# Patient Record
Sex: Female | Born: 1985 | Hispanic: Yes | Marital: Married | Smoking: Current every day smoker
Health system: Southern US, Community
[De-identification: ages and names within clinical notes are randomized; demographics above are authoritative.]

## PROBLEM LIST (undated history)

## (undated) HISTORY — PX: NO PAST SURGERIES: SHX2092

---

## 2020-05-12 ENCOUNTER — Other Ambulatory Visit: Payer: Self-pay

## 2020-05-12 ENCOUNTER — Emergency Department
Admission: EM | Admit: 2020-05-12 | Discharge: 2020-05-12 | Disposition: A | Discharge status: Home / Self Care | Payer: BC Managed Care – PPO | Attending: Emergency Medicine | Admitting: Emergency Medicine

## 2020-05-12 ENCOUNTER — Ambulatory Visit
Admission: EM | Admit: 2020-05-12 | Discharge: 2020-05-12 | Disposition: A | Discharge status: Other Acute Inpatient Hospital | Payer: BC Managed Care – PPO

## 2020-05-12 ENCOUNTER — Emergency Department: Payer: BC Managed Care – PPO

## 2020-05-12 DIAGNOSIS — R197 Diarrhea, unspecified: Secondary | ICD-10-CM | POA: Diagnosis not present

## 2020-05-12 DIAGNOSIS — K2900 Acute gastritis without bleeding: Secondary | ICD-10-CM | POA: Insufficient documentation

## 2020-05-12 DIAGNOSIS — F17210 Nicotine dependence, cigarettes, uncomplicated: Secondary | ICD-10-CM | POA: Insufficient documentation

## 2020-05-12 DIAGNOSIS — R109 Unspecified abdominal pain: Secondary | ICD-10-CM | POA: Diagnosis present

## 2020-05-12 DIAGNOSIS — R112 Nausea with vomiting, unspecified: Secondary | ICD-10-CM | POA: Insufficient documentation

## 2020-05-12 DIAGNOSIS — R1013 Epigastric pain: Secondary | ICD-10-CM | POA: Diagnosis not present

## 2020-05-12 LAB — URINALYSIS, COMPLETE (UACMP) WITH MICROSCOPIC
Bacteria, UA: NONE SEEN
Bilirubin Urine: NEGATIVE
Glucose, UA: NEGATIVE mg/dL
Hgb urine dipstick: NEGATIVE
Ketones, ur: 80 mg/dL — AB
Leukocytes,Ua: NEGATIVE
Nitrite: NEGATIVE
Protein, ur: 100 mg/dL — AB
Specific Gravity, Urine: 1.033 — ABNORMAL HIGH (ref 1.005–1.030)
pH: 6 (ref 5.0–8.0)

## 2020-05-12 LAB — CBC
HCT: 44.3 % (ref 36.0–46.0)
Hemoglobin: 14.7 g/dL (ref 12.0–15.0)
MCH: 31.1 pg (ref 26.0–34.0)
MCHC: 33.2 g/dL (ref 30.0–36.0)
MCV: 93.7 fL (ref 80.0–100.0)
Platelets: 297 10*3/uL (ref 150–400)
RBC: 4.73 MIL/uL (ref 3.87–5.11)
RDW: 13.3 % (ref 11.5–15.5)
WBC: 8.6 10*3/uL (ref 4.0–10.5)
nRBC: 0 % (ref 0.0–0.2)

## 2020-05-12 LAB — COMPREHENSIVE METABOLIC PANEL
ALT: 14 U/L (ref 0–44)
AST: 19 U/L (ref 15–41)
Albumin: 4.7 g/dL (ref 3.5–5.0)
Alkaline Phosphatase: 75 U/L (ref 38–126)
Anion gap: 10 (ref 5–15)
BUN: 10 mg/dL (ref 6–20)
CO2: 25 mmol/L (ref 22–32)
Calcium: 9.2 mg/dL (ref 8.9–10.3)
Chloride: 103 mmol/L (ref 98–111)
Creatinine, Ser: 0.88 mg/dL (ref 0.44–1.00)
GFR calc Af Amer: >60 mL/min (ref >60)
GFR calc non Af Amer: >60 mL/min (ref >60)
Glucose, Bld: 114 mg/dL — ABNORMAL HIGH (ref 70–99)
Potassium: 3.7 mmol/L (ref 3.5–5.1)
Sodium: 138 mmol/L (ref 135–145)
Total Bilirubin: 0.9 mg/dL (ref 0.3–1.2)
Total Protein: 7.9 g/dL (ref 6.5–8.1)

## 2020-05-12 LAB — POCT PREGNANCY, URINE: Preg Test, Ur: NEGATIVE

## 2020-05-12 LAB — LIPASE, BLOOD: Lipase: 29 U/L (ref 11–51)

## 2020-05-12 MED ORDER — IOHEXOL 300 MG/ML  SOLN
100.0000 mL | Freq: Once | INTRAMUSCULAR | Status: AC | PRN
  Administered 2020-05-12: 100 mL via INTRAVENOUS
  Filled 2020-05-12: qty 100

## 2020-05-12 MED ORDER — ONDANSETRON HCL 4 MG/2ML IJ SOLN: 4.0000 mg | Freq: Once | INTRAMUSCULAR | Status: DC

## 2020-05-12 MED ORDER — SODIUM CHLORIDE 0.9% FLUSH
3.0000 mL | Freq: Once | INTRAVENOUS | Status: AC
  Administered 2020-05-12: 3 mL via INTRAVENOUS

## 2020-05-12 MED ORDER — OMEPRAZOLE 20 MG PO CPDR: 20.0000 mg | DELAYED_RELEASE_CAPSULE | Freq: Every day | ORAL | 0 refills | Status: AC

## 2020-05-12 MED ORDER — SODIUM CHLORIDE 0.9 % IV BOLUS
1000.0000 mL | Freq: Once | INTRAVENOUS | Status: AC
  Administered 2020-05-12: 1000 mL via INTRAVENOUS

## 2020-05-12 MED ORDER — PROMETHAZINE HCL 12.5 MG PO TABS
12.5000 mg | ORAL_TABLET | Freq: Four times a day (QID) | ORAL | 0 refills | Status: AC | PRN
Start: 2020-05-12 — End: ?

## 2020-05-12 MED ORDER — ONDANSETRON 4 MG PO TBDP
4.0000 mg | ORAL_TABLET | Freq: Once | ORAL | Status: AC
  Administered 2020-05-12: 4 mg via ORAL
  Filled 2020-05-12: qty 1

## 2020-05-12 MED ORDER — PROMETHAZINE HCL 25 MG/ML IJ SOLN
12.5000 mg | Freq: Once | INTRAMUSCULAR | Status: AC
  Administered 2020-05-12: 12.5 mg via INTRAVENOUS
  Filled 2020-05-12: qty 1

## 2020-05-12 NOTE — ED Notes (Signed)
Patient is being discharged from the Urgent Care and sent to the Emergency Department via POV . Per Wendee Beavers, NP  patient is in need of higher level of care due to Severe Abdominal Pain. Patient is aware and verbalizes understanding of plan of care.  Vitals:   05/12/20 1535  BP: 136/80  Pulse: 82  Resp: 16  Temp: 98.5 F (36.9 C)  SpO2: 99%

## 2020-05-12 NOTE — ED Provider Notes (Signed)
Roderic Palau    CSN: [id number] Arrival date & time: 05/12/20  1533      History   Chief Complaint Chief Complaint  Patient presents with  . Abdominal Pain    HPI Ommie Degeorge is a 34 y.o. female.   Patient presents with abdominal pain since 05/09/2020.  She states the pain is getting worse, 9/10, sharp and throbbing in the epigastric region.  She also reports nausea, vomiting, and diarrhea.  1 episode of emesis today; no diarrhea since yesterday.  She states her symptoms started after drinking a homemade detox juice (pineapple, celery, and other fruits); last had yesterday; none today.  Today she has been unable to eat any food but has had some water to drink.  She denies fever or chills.  The history is provided by the patient.    History reviewed. No pertinent past medical history.  There are no problems to display for this patient.   Past Surgical History:  Procedure Laterality Date  . NO PAST SURGERIES      OB History   No obstetric history on file.      Home Medications    Prior to Admission medications   Not on File    Family History Family History  Problem Relation Age of Onset  . Diabetes Father     Social History Social History   Tobacco Use  . Smoking status: Current Every Day Smoker    Packs/day: 0.50    Types: Cigarettes  . Smokeless tobacco: Never Used  Vaping Use  . Vaping Use: Never used  Substance Use Topics  . Alcohol use: Not Currently  . Drug use: Not Currently     Allergies   Patient has no known allergies.   Review of Systems Review of Systems  Constitutional: Negative for chills and fever.  HENT: Negative for ear pain and sore throat.   Eyes: Negative for pain and visual disturbance.  Respiratory: Negative for cough and shortness of breath.   Cardiovascular: Negative for chest pain and palpitations.  Gastrointestinal: Positive for abdominal pain, diarrhea, nausea and vomiting.  Genitourinary: Negative for  dysuria and hematuria.  Musculoskeletal: Negative for arthralgias and back pain.  Skin: Negative for color change and rash.  Neurological: Negative for seizures and syncope.  All other systems reviewed and are negative.    Physical Exam Triage Vital Signs ED Triage Vitals  Enc Vitals Group     BP 05/12/20 1535 136/80     Pulse Rate 05/12/20 1535 82     Resp 05/12/20 1535 16     Temp 05/12/20 1535 98.5 F (36.9 C)     Temp Source 05/12/20 1535 Oral     SpO2 05/12/20 1535 99 %     Weight 05/12/20 1535 190 lb (86.2 kg)     Height 05/12/20 1535 5\' 6"  (1.676 m)     Head Circumference --      Peak Flow --      Pain Score 05/12/20 1534 9     Pain Loc --      Pain Edu? --      Excl. in [place]? --    No data found.  Updated Vital Signs BP 136/80 (BP Location: Left Arm)   Pulse 82   Temp 98.5 F (36.9 C) (Oral)   Resp 16   Ht 5\' 6"  (1.676 m)   Wt 190 lb (86.2 kg)   LMP 05/05/2020   SpO2 99%   BMI 30.67 kg/m  Visual Acuity Right Eye Distance:   Left Eye Distance:   Bilateral Distance:    Right Eye Near:   Left Eye Near:    Bilateral Near:     Physical Exam Vitals and nursing note reviewed.  Constitutional:      General: She is in acute distress.     Appearance: She is well-developed.     Comments: Patient grimacing and bent over holding abdomen.   HENT:     Head: Normocephalic and atraumatic.     Mouth/Throat:     Mouth: Mucous membranes are moist.  Eyes:     Conjunctiva/sclera: Conjunctivae normal.  Cardiovascular:     Rate and Rhythm: Normal rate and regular rhythm.     Heart sounds: No murmur heard.   Pulmonary:     Effort: Pulmonary effort is normal. No respiratory distress.     Breath sounds: Normal breath sounds.  Abdominal:     General: Bowel sounds are normal.     Palpations: Abdomen is soft.     Tenderness: There is abdominal tenderness in the epigastric area. There is no right CVA tenderness, left CVA tenderness, guarding or rebound.    Musculoskeletal:     Cervical back: Neck supple.  Skin:    General: Skin is warm and dry.     Findings: No rash.  Neurological:     General: No focal deficit present.     Mental Status: She is alert and oriented to person, place, and time.  Psychiatric:        Mood and Affect: Mood normal.        Behavior: Behavior normal.      UC Treatments / Results  Labs (all labs ordered are listed, but only abnormal results are displayed) Labs Reviewed - No data to display  EKG   Radiology No results found.  Procedures Procedures (including critical care time)  Medications Ordered in UC Medications - No data to display  Initial Impression / Assessment and Plan / UC Course  I have reviewed the triage vital signs and the nursing notes.  Pertinent labs & imaging results that were available during my care of the patient were reviewed by me and considered in my medical decision making (see chart for details).   Abdominal pain; Nausea, vomiting, and diarrhea.  Sending patient to ED for evaluation.  Patient declines EMS; states she is stable to drive herself.     Final Clinical Impressions(s) / UC Diagnoses   Final diagnoses:  Abdominal pain, epigastric  Nausea, vomiting, and diarrhea     Discharge Instructions     Go to the Emergency Department for evaluation of your acute abdominal pain.     ED Prescriptions    None     PDMP not reviewed this encounter.   Mickie Bail, NP 05/12/20 1559

## 2020-05-12 NOTE — ED Notes (Addendum)
Pt states was told she would be given pain & nausea meds right before d/c. Notified provider Triplett of pt's statement.

## 2020-05-12 NOTE — Discharge Instructions (Addendum)
Go to the Emergency Department for evaluation of your acute abdominal pain.

## 2020-05-12 NOTE — ED Triage Notes (Signed)
Patient states that on Monday she started drinking a Detox juice and has been having abdominal pain and diarrhea since.

## 2020-05-12 NOTE — ED Notes (Signed)
Pt walked out to car and returned stating she does want to be seen. Pt placed back in line.

## 2020-05-12 NOTE — ED Notes (Signed)
Pt ambulatory to bathroom after reporting nausea. Pt found vomiting in bathroom. Pt then able to ambulate back without distress.

## 2020-05-12 NOTE — ED Triage Notes (Signed)
Pt here with abd that started Monday and has gotten worse.

## 2020-05-16 NOTE — ED Provider Notes (Signed)
[facility] Emergency Department Provider Note ____________________________________________   First MD Initiated Contact with Patient 05/12/20 2046     (approximate)  I have reviewed the triage vital signs and the nursing notes.   HISTORY  Chief Complaint Abdominal Pain  HPI Jade Carter is a 34 y.o. female presenting to the emergency department for treatment and evaluation of abdominal pain.  Pain started 4 days ago and has progressively worsened.  Food seems to make the pain worse.  No alleviating measures attempted prior to arrival.  No similar symptoms in the past.       History reviewed. No pertinent past medical history.  There are no problems to display for this patient.   Past Surgical History:  Procedure Laterality Date  . NO PAST SURGERIES      Prior to Admission medications   Medication Sig Start Date End Date Taking? Authorizing Provider  omeprazole (PRILOSEC) 20 MG capsule Take 1 capsule (20 mg total) by mouth daily. 05/12/20   Herman Fiero, Kasandra Knudsen, FNP  promethazine (PHENERGAN) 12.5 MG tablet Take 1 tablet (12.5 mg total) by mouth every 6 (six) hours as needed for nausea or vomiting. 05/12/20   Kem Boroughs B, FNP    Allergies Patient has no known allergies.  Family History  Problem Relation Age of Onset  . Diabetes Father     Social History Social History   Tobacco Use  . Smoking status: Current Every Day Smoker    Packs/day: 0.50    Types: Cigarettes  . Smokeless tobacco: Never Used  Vaping Use  . Vaping Use: Never used  Substance Use Topics  . Alcohol use: Not Currently  . Drug use: Not Currently    Review of Systems  Constitutional: No fever/chills Eyes: No visual changes. ENT: No sore throat. Cardiovascular: Denies chest pain. Respiratory: Denies shortness of breath. Gastrointestinal: Positive abdominal pain.  Positive for nausea, no vomiting.  No diarrhea.  No constipation. Genitourinary: Negative for  dysuria. Musculoskeletal: Negative for back pain. Skin: Negative for rash. Neurological: Negative for headaches, focal weakness or numbness. ____________________________________________   PHYSICAL EXAM:  VITAL SIGNS: ED Triage Vitals  Enc Vitals Group     BP 05/12/20 1614 (!) 130/92     Pulse Rate 05/12/20 1614 94     Resp 05/12/20 1614 17     Temp 05/12/20 1614 98.8 F (37.1 C)     Temp Source 05/12/20 1614 Oral     SpO2 05/12/20 1614 100 %     Weight 05/12/20 1615 184 lb (83.5 kg)     Height 05/12/20 1615 5\' 6"  (1.676 m)     Head Circumference --      Peak Flow --      Pain Score 05/12/20 1625 8     Pain Loc --      Pain Edu? --      Excl. in [place]? --     Constitutional: Alert and oriented. Well appearing and in no acute distress. Eyes: Conjunctivae are normal. PERRL. EOMI. Head: Atraumatic. Nose: No congestion/rhinnorhea. Mouth/Throat: Mucous membranes are moist.  Oropharynx non-erythematous. Neck: No stridor.   Hematological/Lymphatic/Immunilogical: No cervical lymphadenopathy. Cardiovascular: Normal rate, regular rhythm. Grossly normal heart sounds.  Good peripheral circulation. Respiratory: Normal respiratory effort.  No retractions. Lungs CTAB. Gastrointestinal: Soft and nontender. No distention. No abdominal bruits. No CVA tenderness. Genitourinary:  Musculoskeletal: No lower extremity tenderness nor edema.  No joint effusions. Neurologic:  Normal speech and language. No gross focal neurologic deficits are  appreciated. No gait instability. Skin:  Skin is warm, dry and intact. No rash noted. Psychiatric: Mood and affect are normal. Speech and behavior are normal.  ____________________________________________   LABS (all labs ordered are listed, but only abnormal results are displayed)  Labs Reviewed  COMPREHENSIVE METABOLIC PANEL - Abnormal; Notable for the following components:      Result Value   Glucose, Bld 114 (*)    All other components within normal  limits  URINALYSIS, COMPLETE (UACMP) WITH MICROSCOPIC - Abnormal; Notable for the following components:   Color, Urine AMBER (*)    APPearance CLOUDY (*)    Specific Gravity, Urine 1.033 (*)    Ketones, ur 80 (*)    Protein, ur 100 (*)    All other components within normal limits  LIPASE, BLOOD  CBC  POC URINE PREG, ED  POCT PREGNANCY, URINE   ____________________________________________  EKG  Not indicated ____________________________________________  RADIOLOGY  ED MD interpretation:    CT of the abdomen and pelvis with contrast shows gastritis/peptic ulcer disease.  I, Kem Boroughs, personally viewed and evaluated these images (plain radiographs) as part of my medical decision making, as well as reviewing the written report by the radiologist.  Official radiology report(s): No results found.  ____________________________________________   PROCEDURES  Procedure(s) performed (including Critical Care):  Procedures  ____________________________________________   INITIAL IMPRESSION / ASSESSMENT AND PLAN    34 year old female presenting to the emergency department for treatment and evaluation of 4 days of abdominal pain and nausea.  See HPI for further details.  Plan will be to review labs that were drawn under protocol while awaiting ER room assignment.  Also will order CT of the abdomen and pelvis.  Patient is aware and agrees with plan.  DIFFERENTIAL DIAGNOSIS  Diverticulitis, diverticulosis, appendicitis, acute cholecystitis, gastritis  ED COURSE  CT shows gastritis/peptic ulcer disease.  She will be given prescriptions for promethazine and omeprazole.  She will also be given follow-up information for GI.  She is to schedule follow-up appointment with her primary care provider as well.  She is to return to the emergency department for symptoms of change or worsen if she is unable to schedule an appointment. ____________________________________________   FINAL  CLINICAL IMPRESSION(S) / ED DIAGNOSES  Final diagnoses:  Acute gastritis without hemorrhage, unspecified gastritis type     ED Discharge Orders         Ordered    promethazine (PHENERGAN) 12.5 MG tablet  Every 6 hours PRN     Discontinue  Reprint     05/12/20 2200    omeprazole (PRILOSEC) 20 MG capsule  Daily     Discontinue  Reprint     05/12/20 2200           Kylia Grajales was evaluated in Emergency Department on 05/16/2020 for the symptoms described in the history of present illness. She was evaluated in the context of the global COVID-19 pandemic, which necessitated consideration that the patient might be at risk for infection with the SARS-CoV-2 virus that causes COVID-19. Institutional protocols and algorithms that pertain to the evaluation of patients at risk for COVID-19 are in a state of rapid change based on information released by regulatory bodies including the CDC and federal and state organizations. These policies and algorithms were followed during the patient's care in the ED.   Note:  This document was prepared using Dragon voice recognition software and may include unintentional dictation errors.   Chinita Pester, FNP 05/16/20 1933  Carrie Mew, MD 05/23/20 2017

## 2022-02-02 IMAGING — CT CT ABD-PELV W/ CM
2 of 4 series · 16 of 46 positions shown, 18 images · IV contrast (omnipaque)
Comparison: None.

CLINICAL DATA: Abdominal pain for 4 days

EXAM:
CT ABDOMEN AND PELVIS WITH CONTRAST
TECHNIQUE: Multidetector CT imaging of the abdomen and pelvis was performed
using the standard protocol following bolus administration of
intravenous contrast.
CONTRAST:  100mL OMNIPAQUE IOHEXOL 300 MG/ML  SOLN

[Series 2: routine abd/pel with · axial · 0.87mm/px · z∈[-347,+93]mm · 13 of 98 slices shown, 15 images]
[im 5/98  soft-tissue]
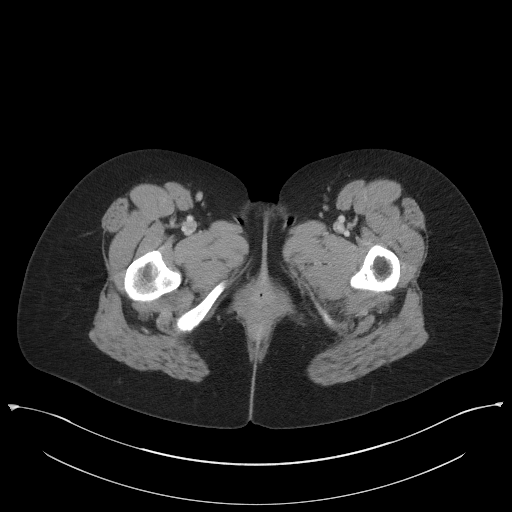
[im 5/98  bone]
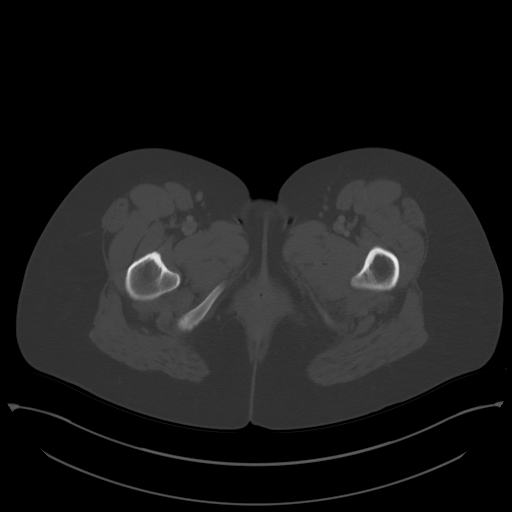
[im 13/98  soft-tissue]
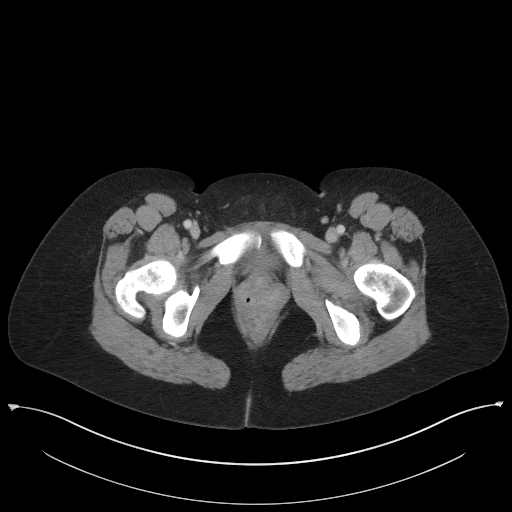
[im 21/98  soft-tissue]
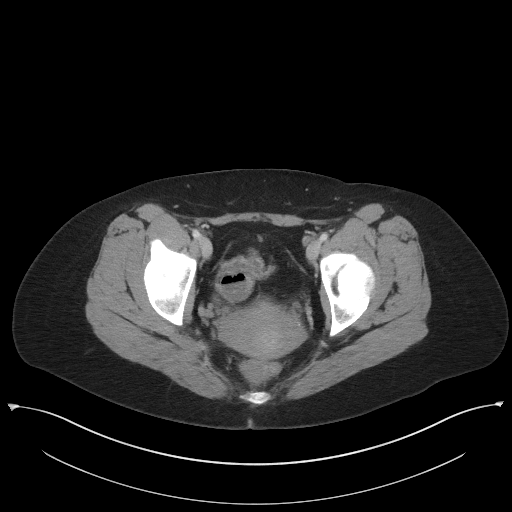
[im 29/98  soft-tissue]
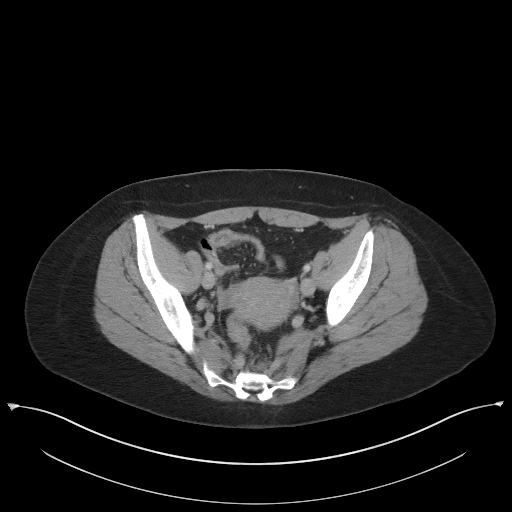
[im 33/98  soft-tissue]
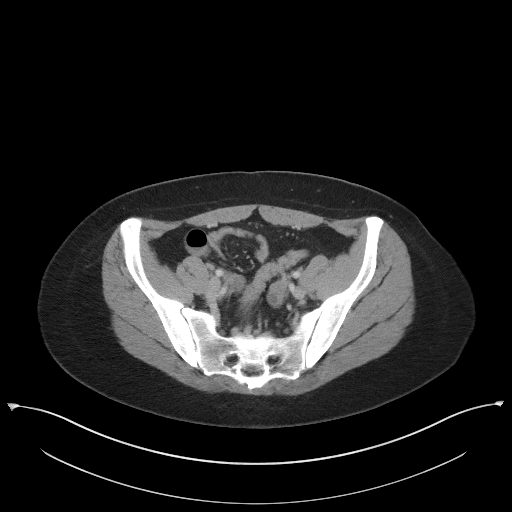
[im 41/98  soft-tissue]
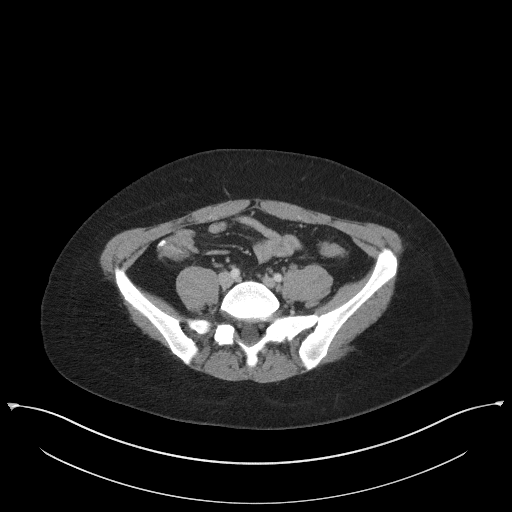
[im 49/98  soft-tissue]
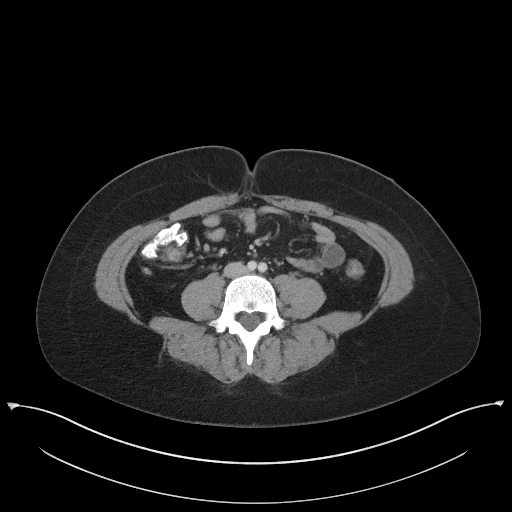
[im 57/98  soft-tissue]
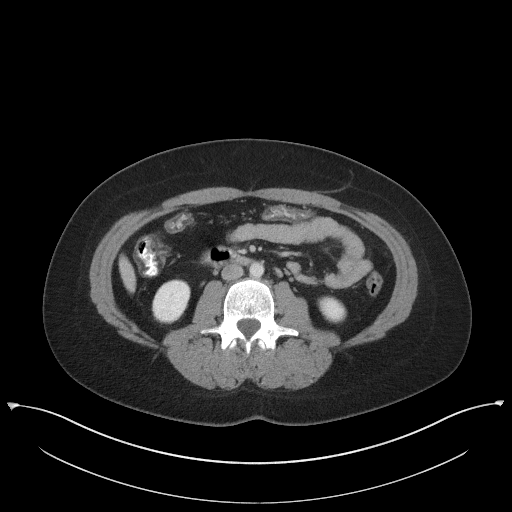
[im 65/98  soft-tissue]
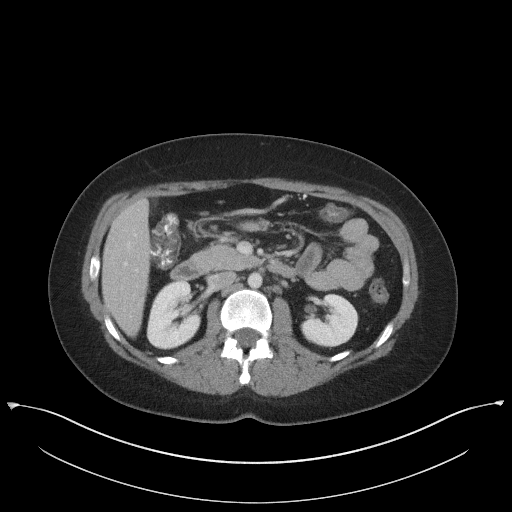
[im 65/98  bone]
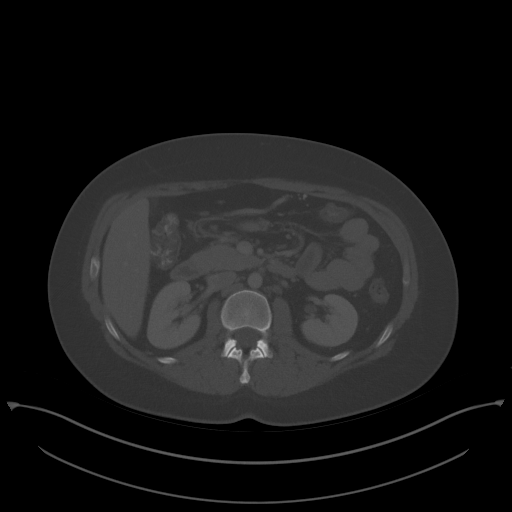
[im 69/98  soft-tissue]
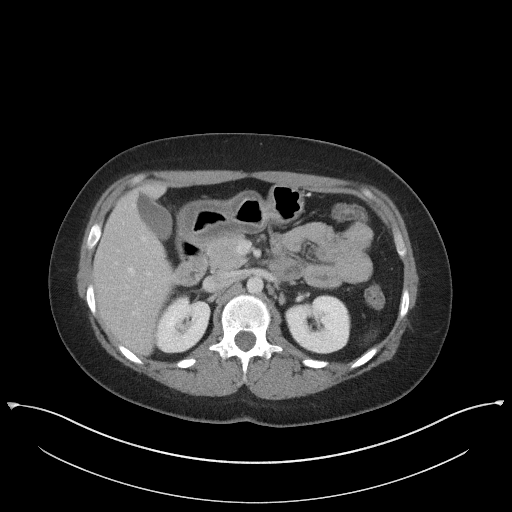
[im 77/98  soft-tissue]
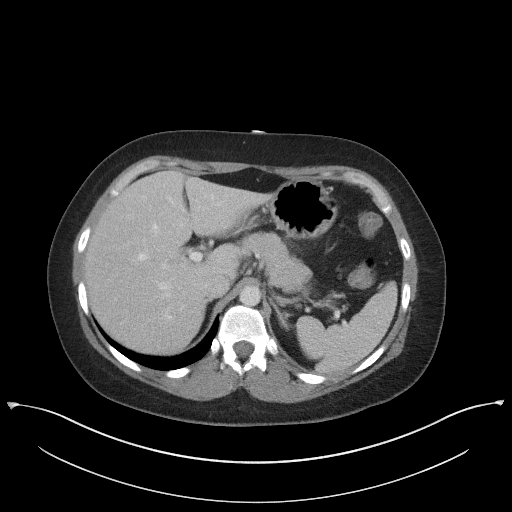
[im 85/98  soft-tissue]
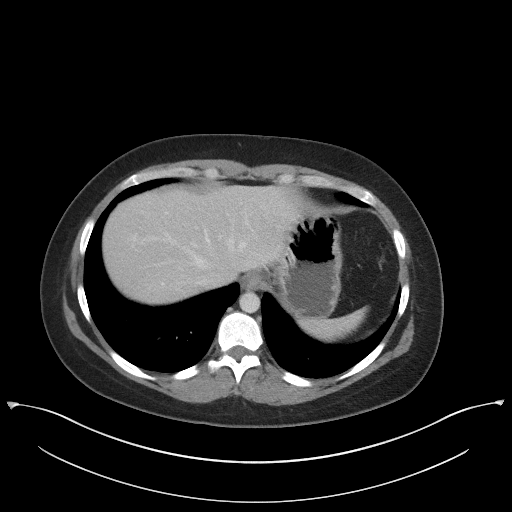
[im 93/98  soft-tissue]
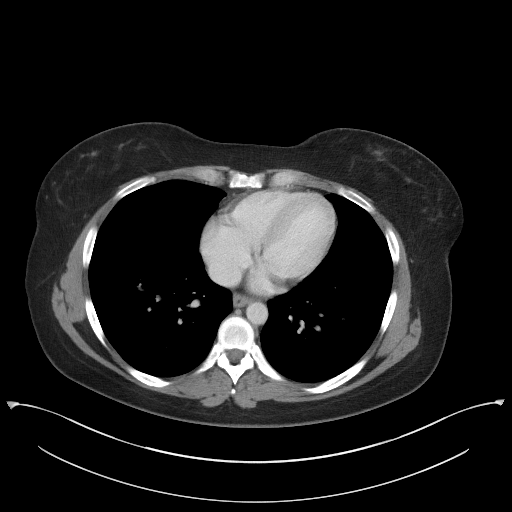

[Series 5: coronal st · coronal · 0.86mm/px · 3 of 79 slices shown]
[im 27/79  soft-tissue]
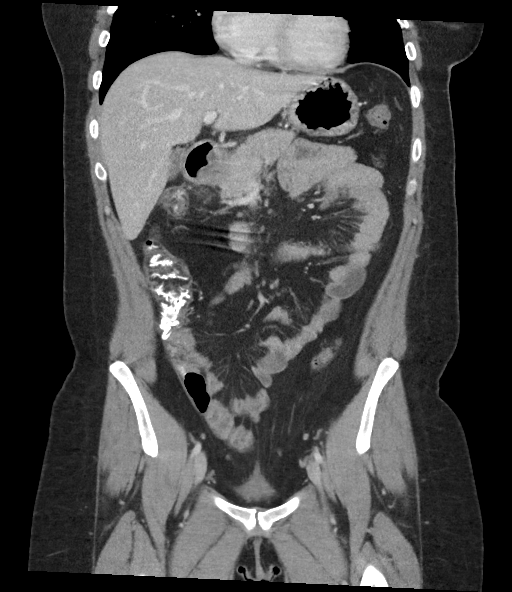
[im 35/79  soft-tissue]
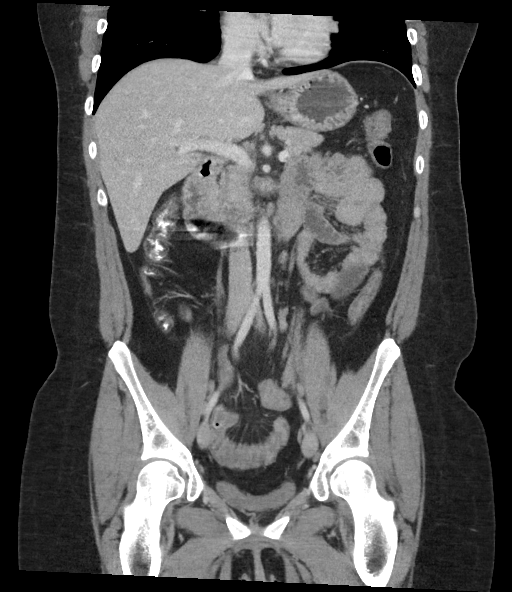
[im 44/79  soft-tissue]
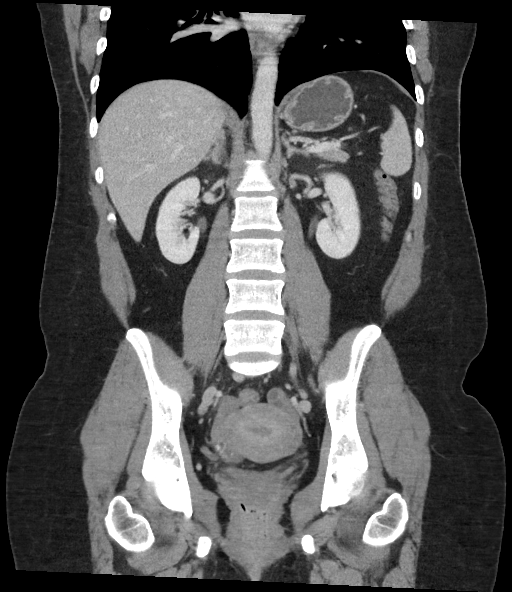

[16 of 46 positions shown; findings below may reference images not displayed]

FINDINGS: Lower chest: No acute pleural or parenchymal lung disease.

Hepatobiliary: No focal liver abnormality is seen. No gallstones,
gallbladder wall thickening, or biliary dilatation.

Pancreas: Unremarkable. No pancreatic ductal dilatation or
surrounding inflammatory changes.

Spleen: Normal in size without focal abnormality.

Adrenals/Urinary Tract: Adrenal glands are unremarkable. Kidneys are
normal, without renal calculi, focal lesion, or hydronephrosis.
Bladder is unremarkable.

Stomach/Bowel: No bowel obstruction or ileus. Normal appendix right
lower quadrant.

There is wall thickening of the gastric antrum, nonspecific.
Findings could reflect gastritis or peptic ulcer disease.

Vascular/Lymphatic: No significant vascular findings are present. No
enlarged abdominal or pelvic lymph nodes.

Reproductive: Uterus and bilateral adnexa are unremarkable.

Other: No abdominal wall hernia or abnormality. No abdominopelvic
ascites.

Musculoskeletal: No acute or destructive bony lesions. Reconstructed
images demonstrate no additional findings.
IMPRESSION: 1. Circumferential wall thickening of the gastric antrum, which
could reflect gastritis or peptic ulcer disease.
2. Otherwise unremarkable CT of the abdomen and pelvis.
